# Patient Record
Sex: Female | Born: 1990 | Race: Black or African American | Hispanic: No | Marital: Single | Smoking: Current every day smoker
Health system: Southern US, Community
[De-identification: ages and names within clinical notes are randomized; demographics above are authoritative.]

## PROBLEM LIST (undated history)

## (undated) HISTORY — PX: TONSILLECTOMY: SUR1361

---

## 2016-04-16 ENCOUNTER — Emergency Department (HOSPITAL_COMMUNITY)
Admission: EM | Admit: 2016-04-16 | Discharge: 2016-04-17 | Disposition: A | Discharge status: Home / Self Care | Payer: No Typology Code available for payment source | Attending: Emergency Medicine | Admitting: Emergency Medicine

## 2016-04-16 ENCOUNTER — Other Ambulatory Visit (HOSPITAL_COMMUNITY): Payer: Self-pay | Admitting: Radiology

## 2016-04-16 ENCOUNTER — Encounter (HOSPITAL_COMMUNITY): Payer: Self-pay | Admitting: Emergency Medicine

## 2016-04-16 DIAGNOSIS — Y939 Activity, unspecified: Secondary | ICD-10-CM | POA: Diagnosis not present

## 2016-04-16 DIAGNOSIS — M542 Cervicalgia: Secondary | ICD-10-CM | POA: Diagnosis not present

## 2016-04-16 DIAGNOSIS — M545 Low back pain, unspecified: Secondary | ICD-10-CM

## 2016-04-16 DIAGNOSIS — Y92410 Unspecified street and highway as the place of occurrence of the external cause: Secondary | ICD-10-CM | POA: Diagnosis not present

## 2016-04-16 DIAGNOSIS — S0990XA Unspecified injury of head, initial encounter: Secondary | ICD-10-CM | POA: Diagnosis not present

## 2016-04-16 DIAGNOSIS — Y999 Unspecified external cause status: Secondary | ICD-10-CM | POA: Insufficient documentation

## 2016-04-16 LAB — POC URINE PREG, ED: PREG TEST UR: NEGATIVE
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
## 2016-04-17 ENCOUNTER — Emergency Department (HOSPITAL_COMMUNITY): Payer: No Typology Code available for payment source

## 2016-04-17 MED ORDER — NAPROXEN 500 MG PO TABS: 500.0000 mg | ORAL_TABLET | Freq: Two times a day (BID) | ORAL | 0 refills | Status: DC

## 2016-04-17 MED ORDER — ACETAMINOPHEN 325 MG PO TABS
650.0000 mg | ORAL_TABLET | Freq: Once | ORAL | Status: AC
  Administered 2016-04-17: 650 mg via ORAL
  Filled 2016-04-17: qty 2

## 2016-04-17 MED ORDER — IBUPROFEN 400 MG PO TABS
400.0000 mg | ORAL_TABLET | Freq: Once | ORAL | Status: AC
  Administered 2016-04-17: 400 mg via ORAL
  Filled 2016-04-17: qty 1

## 2016-04-17 MED ORDER — METHOCARBAMOL 500 MG PO TABS: 1000.0000 mg | ORAL_TABLET | Freq: Four times a day (QID) | ORAL | 0 refills | Status: DC
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
## 2016-05-04 ENCOUNTER — Emergency Department (HOSPITAL_COMMUNITY)
Admission: EM | Admit: 2016-05-04 | Discharge: 2016-05-04 | Disposition: A | Discharge status: Home / Self Care | Payer: Self-pay | Attending: Emergency Medicine | Admitting: Emergency Medicine

## 2016-05-04 ENCOUNTER — Encounter (HOSPITAL_COMMUNITY): Payer: Self-pay

## 2016-05-04 DIAGNOSIS — T7840XA Allergy, unspecified, initial encounter: Secondary | ICD-10-CM | POA: Insufficient documentation

## 2016-05-04 DIAGNOSIS — L509 Urticaria, unspecified: Secondary | ICD-10-CM | POA: Insufficient documentation

## 2016-05-04 DIAGNOSIS — F17210 Nicotine dependence, cigarettes, uncomplicated: Secondary | ICD-10-CM | POA: Insufficient documentation

## 2016-05-04 MED ORDER — METHYLPREDNISOLONE SODIUM SUCC 125 MG IJ SOLR
125.0000 mg | Freq: Once | INTRAMUSCULAR | Status: AC
  Administered 2016-05-04: 125 mg via INTRAVENOUS
  Filled 2016-05-04: qty 2

## 2016-05-04 MED ORDER — DIPHENHYDRAMINE HCL 25 MG PO CAPS
ORAL_CAPSULE | ORAL | Status: AC
  Filled 2016-05-04: qty 1

## 2016-05-04 MED ORDER — DIPHENHYDRAMINE HCL 25 MG PO CAPS
25.0000 mg | ORAL_CAPSULE | Freq: Once | ORAL | Status: AC
  Administered 2016-05-04: 25 mg via ORAL

## 2016-05-04 MED ORDER — DIPHENHYDRAMINE HCL 50 MG/ML IJ SOLN
12.5000 mg | Freq: Once | INTRAMUSCULAR | Status: AC
  Administered 2016-05-04: 12.5 mg via INTRAVENOUS
  Filled 2016-05-04: qty 1

## 2016-05-04 MED ORDER — EPINEPHRINE 0.3 MG/0.3ML IJ SOAJ: 0.3000 mg | Freq: Once | INTRAMUSCULAR | 1 refills | Status: AC

## 2016-05-04 MED ORDER — FAMOTIDINE IN NACL 20-0.9 MG/50ML-% IV SOLN
20.0000 mg | Freq: Once | INTRAVENOUS | Status: AC
  Administered 2016-05-04: 20 mg via INTRAVENOUS
  Filled 2016-05-04: qty 50

## 2016-05-04 MED ORDER — DIPHENHYDRAMINE HCL 25 MG PO TABS: 25.0000 mg | ORAL_TABLET | Freq: Three times a day (TID) | ORAL | 0 refills | Status: DC | PRN

## 2016-05-04 MED ORDER — FAMOTIDINE 20 MG PO TABS: 20.0000 mg | ORAL_TABLET | Freq: Two times a day (BID) | ORAL | 0 refills | Status: DC

## 2016-05-04 MED ORDER — PREDNISONE 20 MG PO TABS: 60.0000 mg | ORAL_TABLET | Freq: Every day | ORAL | 0 refills | Status: DC
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
## 2016-05-04 NOTE — ED Provider Notes (Signed)
[facility]-EMERGENCY DEPT Provider Note   CSN: [id number] Arrival date & time: 05/04/16  1740   By signing my name below, I, Martha Zuniga, attest that this documentation has been prepared under the direction and in the presence of Linwood Dibbles, MD . Electronically Signed: Freida Zuniga, Scribe. 05/04/2016. 6:45 PM.  History   Chief Complaint Chief Complaint  Patient presents with  . Allergic Reaction    The history is provided by the patient. No language interpreter was used.    HPI Comments:  Martha Zuniga is a 26 y.o. female who presents to the Emergency Department complaining of a pruritic diffuse rash which began ~1730 today. Pt states she took motrin prior to symptom onset. She has taken motrin in the past without incident but states she has had a mild reaction to a medication containing ibuprofen in the past and has never been sure if the reaction was due to the  ibuprofen. Pt denies SOB. No difficulty swallowing. No other new meds/lotions/soaps.   History reviewed. No pertinent past medical history.  There are no active problems to display for this patient.   Past Surgical History:  Procedure Laterality Date  . TONSILLECTOMY      OB History    No data available       Home Medications    Prior to Admission medications   Medication Sig Start Date End Date Taking? Authorizing Provider  diphenhydrAMINE (BENADRYL) 25 MG tablet Take 1 tablet (25 mg total) by mouth every 8 (eight) hours as needed for itching. 05/04/16   Linwood Dibbles, MD  famotidine (PEPCID) 20 MG tablet Take 1 tablet (20 mg total) by mouth 2 (two) times daily. 05/04/16   Linwood Dibbles, MD  methocarbamol (ROBAXIN) 500 MG tablet Take 2 tablets (1,000 mg total) by mouth 4 (four) times daily. 04/17/16   Renne Crigler, PA-C  naproxen (NAPROSYN) 500 MG tablet Take 1 tablet (500 mg total) by mouth 2 (two) times daily. 04/17/16   Renne Crigler, PA-C  predniSONE (DELTASONE) 20 MG tablet Take 3 tablets (60 mg total) by mouth daily.  05/04/16   Linwood Dibbles, MD    Family History History reviewed. No pertinent family history.  Social History Social History  Substance Use Topics  . Smoking status: Current Every Day Smoker    Packs/day: 0.50    Types: Cigarettes  . Smokeless tobacco: Never Used  . Alcohol use Yes     Comment: rarely     Allergies   Tylenol [acetaminophen]; Ibuprofen; and Penicillins   Review of Systems Review of Systems  Constitutional: Negative for chills and fever.  HENT: Negative for trouble swallowing.   Respiratory: Negative for shortness of breath.   Cardiovascular: Negative for chest pain.  Skin: Positive for rash.     Physical Exam Updated Vital Signs BP 98/65 (BP Location: Left Arm)   Pulse 80   Temp 97.5 F (36.4 C) (Oral)   Resp 18   Ht  (1.753 m)   Wt (!) 155.6 kg   LMP 05/04/2016 (Exact Date)   SpO2 100%   BMI 50.65 kg/m   Physical Exam  Constitutional: She appears well-developed and well-nourished. No distress.  HENT:  Head: Normocephalic and atraumatic.  Right Ear: External ear normal.  Left Ear: External ear normal.  Mouth/Throat: Oropharynx is clear and moist. No uvula swelling.  Eyes: Conjunctivae are normal. Right eye exhibits no discharge. Left eye exhibits no discharge. No scleral icterus.  Neck: Neck supple. No tracheal deviation present.  Cardiovascular:  Normal rate, regular rhythm and intact distal pulses.   Pulmonary/Chest: Effort normal and breath sounds normal. No stridor. No respiratory distress. She has no wheezes. She has no rales.  Abdominal: Soft. Bowel sounds are normal. She exhibits no distension. There is no tenderness. There is no rebound and no guarding.  Musculoskeletal: She exhibits no edema or tenderness.  Neurological: She is alert. She has normal strength. No sensory deficit. Cranial nerve deficit: no gross deficits. She exhibits normal muscle tone. She displays no seizure activity. Coordination normal.  Skin: Skin is warm and  dry. Rash noted. Rash is urticarial.  diffusely on extremities and torso   Psychiatric: She has a normal mood and affect.  Nursing note and vitals reviewed.    ED Treatments / Results  DIAGNOSTIC STUDIES:  Oxygen Saturation is 100% on RA, normal by my interpretation.    COORDINATION OF CARE:  6:45 PM Discussed treatment plan with pt at bedside and pt agreed to plan.  Labs (all labs ordered are listed, but only abnormal results are displayed) Labs Reviewed - No data to display  EKG  EKG Interpretation None       Radiology No results found.  Procedures Procedures (including critical care time)  Medications Ordered in ED Medications  diphenhydrAMINE (BENADRYL) capsule 25 mg (25 mg Oral Given 05/04/16 1755)  diphenhydrAMINE (BENADRYL) injection 12.5 mg (12.5 mg Intravenous Given 05/04/16 1859)  famotidine (PEPCID) IVPB 20 mg premix (0 mg Intravenous Stopped 05/04/16 1929)  methylPREDNISolone sodium succinate (SOLU-MEDROL) 125 mg/2 mL injection 125 mg (125 mg Intravenous Given 05/04/16 1859)     Initial Impression / Assessment and Plan / ED Course  I have reviewed the triage vital signs and the nursing notes.  Pertinent labs & imaging results that were available during my care of the patient were reviewed by me and considered in my medical decision making (see chart for details).  Clinical Course as of May 09 1322  Thu May 04, 2016  2052 Sx improved after treatment.  No further hives   [JK]    Clinical Course User Index [JK] Linwood Dibbles, MD    Consistent with an allergic reaction.  No resp sx.  Sx improved with treatment in the ED.   Dc home with antihistamines, steroids and epi pen in case she has a recurrent reaction.  Follow up with an allergist  Final Clinical Impressions(s) / ED Diagnoses   Final diagnoses:  Allergic reaction, initial encounter  Urticaria    New Prescriptions Discharge Medication List as of 05/04/2016  8:52 PM    START taking these  medications   Details  diphenhydrAMINE (BENADRYL) 25 MG tablet Take 1 tablet (25 mg total) by mouth every 8 (eight) hours as needed for itching., Starting Thu 05/04/2016, Print    EPINEPHrine 0.3 mg/0.3 mL IJ SOAJ injection Inject 0.3 mLs (0.3 mg total) into the muscle once., Starting Thu 05/04/2016, Print    famotidine (PEPCID) 20 MG tablet Take 1 tablet (20 mg total) by mouth 2 (two) times daily., Starting Thu 05/04/2016, Print    predniSONE (DELTASONE) 20 MG tablet Take 3 tablets (60 mg total) by mouth daily., Starting Thu 05/04/2016, Print       I personally performed the services described in this documentation, which was scribed in my presence.  The recorded information has been reviewed and is accurate.     Linwood Dibbles, MD 05/08/16 1325

## 2016-05-04 NOTE — ED Triage Notes (Signed)
Pt endorses taking motrin about 1 hour prior to arrival and began having hives approximately 30 minutes later. Pt has hives all over. Pt only has allergy to penicillin and tylenol. Pt has no other known allergies. Airway intact. No throat swelling.

## 2016-09-30 ENCOUNTER — Encounter (HOSPITAL_COMMUNITY): Payer: Self-pay | Admitting: Emergency Medicine

## 2016-09-30 ENCOUNTER — Emergency Department (HOSPITAL_COMMUNITY)
Admission: EM | Admit: 2016-09-30 | Discharge: 2016-09-30 | Disposition: A | Discharge status: Home / Self Care | Payer: PRIVATE HEALTH INSURANCE | Attending: Emergency Medicine | Admitting: Emergency Medicine

## 2016-09-30 DIAGNOSIS — R112 Nausea with vomiting, unspecified: Secondary | ICD-10-CM | POA: Insufficient documentation

## 2016-09-30 DIAGNOSIS — R110 Nausea: Secondary | ICD-10-CM

## 2016-09-30 DIAGNOSIS — F17210 Nicotine dependence, cigarettes, uncomplicated: Secondary | ICD-10-CM | POA: Diagnosis not present

## 2016-09-30 DIAGNOSIS — R1011 Right upper quadrant pain: Secondary | ICD-10-CM | POA: Insufficient documentation

## 2016-09-30 LAB — COMPREHENSIVE METABOLIC PANEL
ALBUMIN: 3.4 g/dL — AB (ref 3.5–5.0)
ALT: 10 U/L — ABNORMAL LOW (ref 14–54)
AST: 15 U/L (ref 15–41)
Alkaline Phosphatase: 51 U/L (ref 38–126)
Anion gap: 9 (ref 5–15)
BUN: 9 mg/dL (ref 6–20)
CO2: 27 mmol/L (ref 22–32)
CREATININE: 0.56 mg/dL (ref 0.44–1.00)
Calcium: 8.8 mg/dL — ABNORMAL LOW (ref 8.9–10.3)
Chloride: 105 mmol/L (ref 101–111)
GFR calc Af Amer: >60 mL/min (ref >60)
GFR calc non Af Amer: >60 mL/min (ref >60)
Glucose, Bld: 93 mg/dL (ref 65–99)
POTASSIUM: 3.8 mmol/L (ref 3.5–5.1)
SODIUM: 141 mmol/L (ref 135–145)
Total Bilirubin: 0.4 mg/dL (ref 0.3–1.2)
Total Protein: 7.2 g/dL (ref 6.5–8.1)

## 2016-09-30 LAB — URINALYSIS, ROUTINE W REFLEX MICROSCOPIC
BILIRUBIN URINE: NEGATIVE
Glucose, UA: NEGATIVE mg/dL
HGB URINE DIPSTICK: NEGATIVE
Ketones, ur: NEGATIVE mg/dL
Nitrite: NEGATIVE
PROTEIN: 30 mg/dL — AB
SPECIFIC GRAVITY, URINE: 1.028 (ref 1.005–1.030)
pH: 6 (ref 5.0–8.0)

## 2016-09-30 LAB — CBC
HEMATOCRIT: 40 % (ref 36.0–46.0)
Hemoglobin: 12.9 g/dL (ref 12.0–15.0)
MCH: 29.2 pg (ref 26.0–34.0)
MCHC: 32.3 g/dL (ref 30.0–36.0)
MCV: 90.5 fL (ref 78.0–100.0)
PLATELETS: 404 10*3/uL — AB (ref 150–400)
RBC: 4.42 MIL/uL (ref 3.87–5.11)
RDW: 14.9 % (ref 11.5–15.5)
WBC: 16.1 10*3/uL — AB (ref 4.0–10.5)

## 2016-09-30 LAB — PREGNANCY, URINE: PREG TEST UR: NEGATIVE

## 2016-09-30 LAB — LIPASE, BLOOD: LIPASE: 21 U/L (ref 11–51)

## 2016-09-30 MED ORDER — ONDANSETRON HCL 4 MG/2ML IJ SOLN
4.0000 mg | Freq: Once | INTRAMUSCULAR | Status: AC
  Administered 2016-09-30: 4 mg via INTRAVENOUS
  Filled 2016-09-30: qty 2

## 2016-09-30 MED ORDER — GI COCKTAIL ~~LOC~~
30.0000 mL | Freq: Once | ORAL | Status: AC
  Administered 2016-09-30: 30 mL via ORAL
  Filled 2016-09-30: qty 30

## 2016-09-30 MED ORDER — DICYCLOMINE HCL 20 MG PO TABS: 20.0000 mg | ORAL_TABLET | Freq: Two times a day (BID) | ORAL | 0 refills | Status: AC

## 2016-09-30 MED ORDER — MORPHINE SULFATE (PF) 4 MG/ML IV SOLN
4.0000 mg | Freq: Once | INTRAVENOUS | Status: AC
  Administered 2016-09-30: 4 mg via INTRAVENOUS
  Filled 2016-09-30: qty 1

## 2016-09-30 MED ORDER — FAMOTIDINE 40 MG PO TABS: 40.0000 mg | ORAL_TABLET | Freq: Every day | ORAL | 0 refills | Status: AC

## 2016-09-30 MED ORDER — DICYCLOMINE HCL 10 MG PO CAPS
10.0000 mg | ORAL_CAPSULE | Freq: Once | ORAL | Status: AC
  Administered 2016-09-30: 10 mg via ORAL
  Filled 2016-09-30: qty 1

## 2016-09-30 MED ORDER — CEPHALEXIN 500 MG PO CAPS: 500.0000 mg | ORAL_CAPSULE | Freq: Four times a day (QID) | ORAL | 0 refills | Status: AC

## 2016-09-30 MED ORDER — ONDANSETRON 4 MG PO TBDP: 4.0000 mg | ORAL_TABLET | Freq: Three times a day (TID) | ORAL | 0 refills | Status: AC | PRN

## 2016-09-30 MED ORDER — DEXTROSE 5 % IV SOLN
1.0000 g | Freq: Once | INTRAVENOUS | Status: AC
  Administered 2016-09-30: 1 g via INTRAVENOUS
  Filled 2016-09-30: qty 10

## 2016-09-30 NOTE — Discharge Instructions (Signed)
You have been seen in the Emergency Department (ED) for abdominal pain.  Your evaluation did not identify a clear cause of your symptoms but was generally reassuring.  I have scheduled a 9 AM ultrasound appointment for tomorrow morning to look at the gallbladder. Come back to the [facility] Emergency Department at 8:30 AM to check in.   Please follow up as instructed above regarding today?s emergent visit and the symptoms that are bothering you.  Return to the ED if your abdominal pain worsens or fails to improve, you develop bloody vomiting, bloody diarrhea, you are unable to tolerate fluids due to vomiting, fever greater than 101, or other symptoms that concern you.

## 2016-09-30 NOTE — ED Triage Notes (Signed)
Patient c/o upper abd pain x3 days that is progressively getting worse. Per patient nausea, vomiting, and diarrhea. Patient unsure of any fevers but reports "sweats and chills." Denies any urinary symptoms.

## 2016-09-30 NOTE — ED Provider Notes (Signed)
Emergency Department Provider Note   I have reviewed the triage vital signs and the nursing notes.   HISTORY  Chief Complaint Abdominal Pain   HPI Martha Zuniga is a 26 y.o. female presents to the [facility] department for evaluation of epigastric abdominal pain that is progressively worsening over the past 3 days. The patient has had nausea, vomiting, diarrhea. She reports some subjective fevers and chills. No dysuria, hesitancy, urgency. No vaginal bleeding or discharge. She states she has not been eating very much so is unsure if it makes it better or worse. Pain does seem to radiate to the back. No lower abdominal pain.    History reviewed. No pertinent past medical history.  There are no active problems to display for this patient.   Past Surgical History:  Procedure Laterality Date  . TONSILLECTOMY      Current Outpatient Rx  . Order #: [id number] Class: Print  . Order #: [id number] Class: Print  . Order #: [id number] Class: Print  . Order #: [id number] Class: Print    Allergies Tylenol [acetaminophen]; Ibuprofen; and Penicillins  History reviewed. No pertinent family history.  Social History Social History  Substance Use Topics  . Smoking status: Current Every Day Smoker    Packs/day: 0.50    Types: Cigarettes  . Smokeless tobacco: Never Used  . Alcohol use Yes     Comment: rarely    Review of Systems  Constitutional: No fever/chills Eyes: No visual changes. ENT: No sore throat. Cardiovascular: Denies chest pain. Respiratory: Denies shortness of breath. Gastrointestinal: Positive epigastric abdominal pain. Positive nausea, vomiting, and diarrhea.  No constipation. Genitourinary: Negative for dysuria. Musculoskeletal: Negative for back pain. Skin: Negative for rash. Neurological: Negative for headaches, focal weakness or numbness.  10-point ROS otherwise negative.  ____________________________________________   PHYSICAL EXAM:  VITAL SIGNS: ED Triage  Vitals [09/30/16 1337]  Enc Vitals Group     BP 115/69     Pulse Rate 60     Resp (!) 22     Temp 97.6 F (36.4 C)     Temp Source Oral     SpO2 99 %     Weight (!) 350 lb (158.8 kg)     Height _0  (1.753 m)     Pain Score 10   Constitutional: Alert and oriented. Tearful and sitting up in bed.  Eyes: Conjunctivae are normal.  Head: Atraumatic. Nose: No congestion/rhinnorhea. Mouth/Throat: Mucous membranes are moist.  Neck: No stridor.  Cardiovascular: Normal rate, regular rhythm. Good peripheral circulation. Grossly normal heart sounds.   Respiratory: Normal respiratory effort.  No retractions. Lungs CTAB. Gastrointestinal: Soft with epigastric and RUQ tenderness. No rebound or guarding. No distention.  Musculoskeletal: No lower extremity tenderness nor edema. No gross deformities of extremities. Neurologic:  Normal speech and language. No gross focal neurologic deficits are appreciated.  Skin:  Skin is warm, dry and intact. No rash noted.  ____________________________________________   LABS (all labs ordered are listed, but only abnormal results are displayed)  Labs Reviewed  COMPREHENSIVE METABOLIC PANEL - Abnormal; Notable for the following:       Result Value   Calcium 8.8 (*)    Albumin 3.4 (*)    ALT 10 (*)    All other components within normal limits  CBC - Abnormal; Notable for the following:    WBC 16.1 (*)    Platelets 404 (*)    All other components within normal limits  URINALYSIS, ROUTINE W REFLEX MICROSCOPIC - Abnormal; Notable for the following:  APPearance CLOUDY (*)    Protein, ur 30 (*)    Leukocytes, UA MODERATE (*)    Bacteria, UA RARE (*)    Squamous Epithelial / LPF 6-30 (*)    All other components within normal limits  URINE CULTURE  LIPASE, BLOOD  PREGNANCY, URINE   ____________________________________________  RADIOLOGY  None ____________________________________________   PROCEDURES  Procedure(s) performed:    Procedures  None ____________________________________________   INITIAL IMPRESSION / ASSESSMENT AND PLAN / ED COURSE  Pertinent labs & imaging results that were available during my care of the patient were reviewed by me and considered in my medical decision making (see chart for details).  Patient presents emergency department for evaluation of epigastric abdominal pain with associated nausea, vomiting, diarrhea. Symptoms most consistent with a gastroenteritis. Patient does, however, have epigastric and right upper quadrant abdominal discomfort to palpation. With gradually worsening pain over several days plan to obtain right upper quadrant ultrasound to evaluate for acute cholecystitis.  Unable to obtain RUQ [place] at this time. Will follow labs to decide on CT now vs RUQ [place] at 9 AM tomorrow.   03:12 PM Patient looking much more comfortable. No active vomiting. Labs with normal LFTs, Bilirubin, and alk phos. Elevated WBC count of unclear significance. With significant clinical improvement and pain on exam that is less severe, plan for deferred CT at this time. Have scheduled 9 AM [place] tomorrow morning here at [facility].   Care transferred to Dr. Roderic Palau pending UA and urine pregnancy. Anticipate discharge +/- abx for UTI pending UA. No evidence to suggest Pyelonephritis.   ____________________________________________  FINAL CLINICAL IMPRESSION(S) / ED DIAGNOSES  Final diagnoses:  RUQ abdominal pain  Nausea     MEDICATIONS GIVEN DURING THIS VISIT:  Medications  ondansetron (ZOFRAN) injection 4 mg (4 mg Intravenous Given 09/30/16 1448)  morphine 4 MG/ML injection 4 mg (4 mg Intravenous Given 09/30/16 1448)  dicyclomine (BENTYL) capsule 10 mg (10 mg Oral Given 09/30/16 1531)  gi cocktail (Maalox,Lidocaine,Donnatal) (30 mLs Oral Given 09/30/16 1531)  cefTRIAXone (ROCEPHIN) 1 g in dextrose 5 % 50 mL IVPB (0 g Intravenous Stopped 09/30/16 1726)     NEW OUTPATIENT MEDICATIONS STARTED  DURING THIS VISIT:  Discharge Medication List as of 09/30/2016  3:55 PM    START taking these medications   Details  dicyclomine (BENTYL) 20 MG tablet Take 1 tablet (20 mg total) by mouth 2 (two) times daily., Starting Sat 09/30/2016, Print    famotidine (PEPCID) 40 MG tablet Take 1 tablet (40 mg total) by mouth daily., Starting Sat 09/30/2016, Print    ondansetron (ZOFRAN ODT) 4 MG disintegrating tablet Take 1 tablet (4 mg total) by mouth every 8 (eight) hours as needed for nausea or vomiting., Starting Sat 09/30/2016, Print        Note:  This document was prepared using Dragon voice recognition software and may include unintentional dictation errors.  Nanda Quinton, MD Emergency Medicine    Shelsea Hangartner, [facility], MD 09/30/16 [phone]

## 2016-09-30 NOTE — ED Provider Notes (Signed)
Patient's urine suggests UTI. She was given some Rocephin and will be put on Keflex She is return tomorrow for ultrasound of abdomen   Bethann Berkshire, MD 09/30/16 1735

## 2016-10-01 ENCOUNTER — Inpatient Hospital Stay (HOSPITAL_COMMUNITY): Admission: RE | Admit: 2016-10-01 | Payer: PRIVATE HEALTH INSURANCE | Source: Ambulatory Visit

## 2016-10-02 LAB — URINE CULTURE

## 2017-08-09 ENCOUNTER — Emergency Department (HOSPITAL_COMMUNITY)
Admission: EM | Admit: 2017-08-09 | Discharge: 2017-08-09 | Disposition: A | Discharge status: Home / Self Care | Payer: PRIVATE HEALTH INSURANCE | Attending: Emergency Medicine | Admitting: Emergency Medicine

## 2017-08-09 ENCOUNTER — Other Ambulatory Visit: Payer: Self-pay

## 2017-08-09 ENCOUNTER — Encounter (HOSPITAL_COMMUNITY): Payer: Self-pay | Admitting: Emergency Medicine

## 2017-08-09 DIAGNOSIS — F17210 Nicotine dependence, cigarettes, uncomplicated: Secondary | ICD-10-CM | POA: Insufficient documentation

## 2017-08-09 DIAGNOSIS — L02211 Cutaneous abscess of abdominal wall: Secondary | ICD-10-CM | POA: Insufficient documentation

## 2017-08-09 DIAGNOSIS — Z79899 Other long term (current) drug therapy: Secondary | ICD-10-CM | POA: Insufficient documentation

## 2017-08-09 NOTE — Discharge Instructions (Signed)
Please soak the area in warm Epsom salt water for 15 min until the area is healed. Finish the Doxycycline. Change the dressing daily. See your MD for additional evaluation if not improving.

## 2017-08-09 NOTE — ED Triage Notes (Signed)
Pt states she has abscess to the left abd area x one week. Pt states she has been taking doxycycline for the same.

## 2017-08-09 NOTE — ED Provider Notes (Signed)
[facility] Provider [facility]   CSN: [id number] Arrival date & time: 08/09/17  2107     History   Chief Complaint Chief Complaint  Patient presents with  . Abscess    HPI Martha Zuniga is a 27 y.o. female.  Pt is a 27 y/o female who presents to the ED with c/o abscess to the left abd. Pt has had abscess for over 1 week. She attempted to "pop" the abscess and now has an open wound present. [facility] and placed on doxycycline. She is concerned that the wound is open and draining at times. No fever, chills, nausea, or vomiting. No hx of compromise to immune system.  The history is provided by the patient.    History reviewed. No pertinent past medical history.  There are no active problems to display for this patient.   Past Surgical History:  Procedure Laterality Date  . TONSILLECTOMY       OB History    Gravida  0   Para  0   Term  0   Preterm  0   AB  0   Living  0     SAB  0   TAB  0   Ectopic  0   Multiple  0   Live Births  0            Home Medications    Prior to Admission medications   Medication Sig Start Date End Date Taking? Authorizing Provider  cephALEXin (KEFLEX) 500 MG capsule Take 1 capsule (500 mg total) by mouth 4 (four) times daily. 09/30/16   Bethann BerkshireZammit, Joseph, MD  dicyclomine (BENTYL) 20 MG tablet Take 1 tablet (20 mg total) by mouth 2 (two) times daily. 09/30/16   Long, Arlyss RepressJoshua G, MD  famotidine (PEPCID) 40 MG tablet Take 1 tablet (40 mg total) by mouth daily. 09/30/16   Long, Arlyss RepressJoshua G, MD  ondansetron (ZOFRAN ODT) 4 MG disintegrating tablet Take 1 tablet (4 mg total) by mouth every 8 (eight) hours as needed for nausea or vomiting. 09/30/16   Long, Arlyss RepressJoshua G, MD    Family History History reviewed. No pertinent family history.  Social History Social History   Tobacco Use  . Smoking status: Current Every Day Smoker    Packs/day: 0.50    Types: Cigarettes  . Smokeless tobacco: Never Used    Substance Use Topics  . Alcohol use: Yes    Comment: rarely  . Drug use: Yes    Types: Marijuana     Allergies   Tylenol [acetaminophen]; Ibuprofen; and Penicillins   Review of Systems Review of Systems  Constitutional: Negative for activity change, diaphoresis and fever.       All ROS Neg except as noted in HPI  HENT: Negative for nosebleeds.   Eyes: Negative for photophobia and discharge.  Respiratory: Negative for cough, shortness of breath and wheezing.   Cardiovascular: Negative for chest pain and palpitations.  Gastrointestinal: Negative for abdominal pain, blood in stool, nausea and vomiting.  Genitourinary: Negative for dysuria, frequency and hematuria.  Musculoskeletal: Negative for arthralgias, back pain and neck pain.  Skin: Positive for wound.       Skin abscess  Neurological: Negative for dizziness, seizures and speech difficulty.  Psychiatric/Behavioral: Negative for confusion and hallucinations.     Physical Exam Updated Vital Signs BP (!) 135/46 (BP Location: Right Arm)   Pulse 100   Temp 98.6 F (37 C) (Oral)   Resp  18   Ht 5\' 9"  (1.753 m)   Wt (!) 147.4 kg (325 lb)   LMP 07/08/2017   SpO2 100%   BMI 47.99 kg/m   Physical Exam  Constitutional: She is oriented to person, place, and time. She appears well-developed and well-nourished.  Non-toxic appearance.  HENT:  Head: Normocephalic.  Right Ear: Tympanic membrane and external ear normal.  Left Ear: Tympanic membrane and external ear normal.  Eyes: Pupils are equal, round, and reactive to light. EOM and lids are normal.  Neck: Normal range of motion. Neck supple. Carotid bruit is not present.  Cardiovascular: Normal rate, regular rhythm, normal heart sounds, intact distal pulses and normal pulses.  Pulmonary/Chest: Breath sounds normal. No respiratory distress.  Abdominal: Soft. Bowel sounds are normal. There is no tenderness. There is no guarding.    Musculoskeletal: Normal range of motion.   Lymphadenopathy:       Head (right side): No submandibular adenopathy present.       Head (left side): No submandibular adenopathy present.    She has no cervical adenopathy.  Neurological: She is alert and oriented to person, place, and time. She has normal strength. No cranial nerve deficit or sensory deficit.  Skin: Skin is warm and dry.  Psychiatric: She has a normal mood and affect. Her speech is normal.  Nursing note and vitals reviewed.    ED Treatments / Results  Labs (all labs ordered are listed, but only abnormal results are displayed) Labs Reviewed - No data to display  EKG None  Radiology No results found.  Procedures Procedures (including critical care time)  Medications Ordered in ED Medications - No data to display   Initial Impression / Assessment and Plan / ED Course  I have reviewed the triage vital signs and the nursing notes.  Pertinent labs & imaging results that were available during my care of the patient were reviewed by me and considered in my medical decision making (see chart for details).       Final Clinical Impressions(s) / ED Diagnoses MDM  Vital signs stable. Pulse ox 100% on room air. Pt's wound is granulating. No red streaks noted.  Pt is on doxycycline. Discussed with patient that the wound should close on it's own, and we wound not close the wound as this will cause more problem. Pt to follow up with PCP or return to the ED if any changes or problem.   Final diagnoses:  Abdominal wall abscess    ED Discharge Orders    None       Ivery Quale, PA-C 08/09/17 2324    Devoria Albe, MD 08/10/17 [phone]

## 2020-05-17 ENCOUNTER — Other Ambulatory Visit: Payer: Self-pay

## 2020-05-17 ENCOUNTER — Encounter (HOSPITAL_COMMUNITY): Payer: Self-pay | Admitting: *Deleted

## 2020-05-17 ENCOUNTER — Emergency Department (HOSPITAL_COMMUNITY): Payer: BLUE CROSS/BLUE SHIELD

## 2020-05-17 ENCOUNTER — Emergency Department (HOSPITAL_COMMUNITY)
Admission: EM | Admit: 2020-05-17 | Discharge: 2020-05-18 | Disposition: A | Discharge status: Home / Self Care | Payer: BLUE CROSS/BLUE SHIELD | Attending: Emergency Medicine | Admitting: Emergency Medicine

## 2020-05-17 DIAGNOSIS — F17210 Nicotine dependence, cigarettes, uncomplicated: Secondary | ICD-10-CM | POA: Diagnosis not present

## 2020-05-17 DIAGNOSIS — K5900 Constipation, unspecified: Secondary | ICD-10-CM | POA: Insufficient documentation

## 2020-05-17 DIAGNOSIS — K6289 Other specified diseases of anus and rectum: Secondary | ICD-10-CM | POA: Diagnosis not present

## 2020-05-17 MED ORDER — FLEET ENEMA 7-19 GM/118ML RE ENEM: 1.0000 | ENEMA | Freq: Every day | RECTAL | 0 refills | Status: AC | PRN

## 2020-05-17 MED ORDER — SENNOSIDES-DOCUSATE SODIUM 8.6-50 MG PO TABS: 1.0000 | ORAL_TABLET | Freq: Every evening | ORAL | 0 refills | Status: AC | PRN

## 2020-05-17 MED ORDER — LIDOCAINE HCL URETHRAL/MUCOSAL 2 % EX GEL
1.0000 "application " | Freq: Once | CUTANEOUS | Status: AC
  Administered 2020-05-17: 1 via TOPICAL
  Filled 2020-05-17: qty 10

## 2020-05-17 MED ORDER — POLYETHYLENE GLYCOL 3350 17 G PO PACK: 17.0000 g | PACK | Freq: Every day | ORAL | 0 refills | Status: AC

## 2020-05-17 NOTE — ED Notes (Signed)
Pt ambulatory to er room number 10, pt states that she has not had a bm since Wednesday, pt states that she is now having spasmodic abd pain.

## 2020-05-17 NOTE — ED Triage Notes (Signed)
No bowel movement for the past 5 days, states she is unable to push stool out due to pain in rectal area

## 2020-05-17 NOTE — Discharge Instructions (Signed)
You were seen in the emergency department today for constipation.  We recommend that you use one or more of the following over-the-counter medications in the order described: °  °1)  Colace (or Dulcolax) 100 mg:  This is a stool softener, and you may take it once or twice a day as needed. °2)  Senna tablets:  This is a bowel stimulant that will help "push" out your stool. It is the next step to add after you have tried a stool softener. °3)  Miralax (powder):  This medication works by drawing additional fluid into your intestines and helps to flush out your stool.  Mix the powder with water or juice according to label instructions.  It may help if the Colace and Senna are not sufficient, but you must be sure to use the recommended amount of water or juice when you mix up the powder. °Remember that narcotic pain medications are constipating, so avoid them or minimize their use.  Drink plenty of fluids. ° °Please return to the Emergency Department immediately if you develop new or worsening symptoms that concern you, such as (but not limited to) fever > 101 degrees, severe abdominal pain, or persistent vomiting. ° °

## 2020-05-17 NOTE — ED Provider Notes (Signed)
Emergency Department Provider Note   I have reviewed the triage vital signs and the nursing notes.   HISTORY  Chief Complaint No chief complaint on file.   HPI Martha Zuniga is a 30 y.o. female presents to the emergency department with constipation and rectal pain.  Symptoms have developed over the past 5 days.  No prior history of constipation.  Patient states it feels like she is going to have a bowel movement but then cannot ultimately passed significant stool.  She is not having bleeding but is having burning pain.  No fevers or chills.  No abdominal discomfort.  No radiation of symptoms or modifying factors.  No dysuria, hesitancy, urgency.  She took 2 sips of magnesium citrate and states that she did take some stool softeners but no significant relief.    History reviewed. No pertinent past medical history.  There are no problems to display for this patient.   Past Surgical History:  Procedure Laterality Date  . TONSILLECTOMY      Allergies Tylenol [acetaminophen], Ibuprofen, and Penicillins  No family history on file.  Social History Social History   Tobacco Use  . Smoking status: Current Every Day Smoker    Packs/day: 0.50    Types: Cigarettes  . Smokeless tobacco: Never Used  Vaping Use  . Vaping Use: Never used  Substance Use Topics  . Alcohol use: Yes    Comment: rarely  . Drug use: Yes    Types: Marijuana    Review of Systems  Constitutional: No fever/chills Eyes: No visual changes. ENT: No sore throat. Cardiovascular: Denies chest pain. Respiratory: Denies shortness of breath. Gastrointestinal: No abdominal pain.  No nausea, no vomiting.  No diarrhea. Positive constipation and rectal discomfort.  Genitourinary: Negative for dysuria. Musculoskeletal: Negative for back pain. Skin: Negative for rash. Neurological: Negative for headaches, focal weakness or numbness.  10-point ROS otherwise  negative.  ____________________________________________   PHYSICAL EXAM:  VITAL SIGNS: ED Triage Vitals  Enc Vitals Group     BP 05/17/20 1847 132/76     Pulse Rate 05/17/20 1847 (!) 55     Resp 05/17/20 1847 16     Temp 05/17/20 1847 98.5 F (36.9 C)     Temp Source 05/17/20 1847 Oral     SpO2 05/17/20 1847 93 %   Constitutional: Alert and oriented. Well appearing and in no acute distress. Eyes: Conjunctivae are normal.  Head: Atraumatic. Nose: No congestion/rhinnorhea. Mouth/Throat: Mucous membranes are moist. Neck: No stridor.   Cardiovascular: Normal rate, regular rhythm. Good peripheral circulation. Grossly normal heart sounds.   Respiratory: Normal respiratory effort.  No retractions. Lungs CTAB. Gastrointestinal: Soft and nontender. No distention.  Rectal exam performed with patient's verbal consent and nurse tech chaperone.  She has no external hemorrhoids.  No fissures or bleeding.  Digital rectal exam performed with some soft, brown stool but no hard, impacted stool.  Musculoskeletal: No gross deformities of extremities. Neurologic:  Normal speech and language.  Skin:  Skin is warm, dry and intact. No rash noted.  ____________________________________________   LABS (all labs ordered are listed, but only abnormal results are displayed)  Labs Reviewed - No data to display ____________________________________________  RADIOLOGY  DG Abdomen 1 View  Result Date: 05/18/2020 CLINICAL DATA:  Constipation for 5 days, no bowel movement since Wednesday, spasmodic abdominal pain EXAM: ABDOMEN - 1 VIEW COMPARISON:  None. FINDINGS: No high-grade obstructive bowel gas pattern. Moderate colonic stool burden with air and stool over the rectal vault.  No suspicious abdominal calcifications. Included lung bases are clear. Osseous structures are free of acute abnormality. IMPRESSION: Moderate colonic stool burden.  No high-grade obstruction. Electronically Signed   By: Kreg Shropshire  M.D.   On: 05/18/2020 00:44    ____________________________________________   PROCEDURES  Procedure(s) performed:   Procedures  None  ____________________________________________   INITIAL IMPRESSION / ASSESSMENT AND PLAN / ED COURSE  Pertinent labs & imaging results that were available during my care of the patient were reviewed by me and considered in my medical decision making (see chart for details).   Patient presents to the emergency department with rectal pain and constipation.  I do not see a external reason for her symptoms such as hemorrhoids or fissures.  She has soft, brown stool palpable in the rectal vault but nothing hard or impacted which I can digitally remove.  She is having some discomfort.  Plan for plain film abdomen pelvis to assess stool burned and discharge home with laxative/enema meds.   Care transferred to Dr. Judd Lien.  ____________________________________________  FINAL CLINICAL IMPRESSION(S) / ED DIAGNOSES  Final diagnoses:  Constipation, unspecified constipation type     MEDICATIONS GIVEN DURING THIS VISIT:  Medications  lidocaine (XYLOCAINE) 2 % jelly 1 application (1 application Topical Given by Other 05/17/20 2152)     NEW OUTPATIENT MEDICATIONS STARTED DURING THIS VISIT:  Discharge Medication List as of 05/18/2020  1:13 AM    START taking these medications   Details  polyethylene glycol (MIRALAX) 17 g packet Take 17 g by mouth daily., Starting Mon 05/17/2020, Normal    senna-docusate (SENOKOT-S) 8.6-50 MG tablet Take 1 tablet by mouth at bedtime as needed for mild constipation or moderate constipation., Starting Mon 05/17/2020, Normal    sodium phosphate (FLEET) 7-19 GM/118ML ENEM Place 133 mLs (1 enema total) rectally daily as needed for moderate constipation or severe constipation., Starting Mon 05/17/2020, Normal        Note:  This document was prepared using Dragon voice recognition software and may include unintentional dictation  errors.  Alona Bene, MD, [facility]    Damon Baisch, Arlyss Repress, MD 05/18/20 [phone]

## 2020-05-18 NOTE — ED Notes (Signed)
Pt to nursing station . Went over d/c papers. No signature obtained. ambulatory to lobby

## 2020-05-18 NOTE — ED Notes (Signed)
To XR

## 2020-05-18 NOTE — ED Provider Notes (Signed)
Care assumed from Dr. Jacqulyn Bath at shift change.  Patient awaiting results of an x-ray to evaluate stool burden.  This has been obtained and shows moderate colonic stool burden, but no high-grade obstruction.  Patient was unable to be disimpacted manually.  She will be discharged with fleets enemas, mag citrate, and work excuse.  She is to follow-up as needed.   Geoffery Lyons, MD 05/18/20 [phone]

## 2021-12-23 IMAGING — DX DG ABDOMEN 1V
2 series · 2 of 2 positions shown · non-contrast
Comparison: None.

CLINICAL DATA: Constipation for 5 days, no bowel movement since
[REDACTED], spasmodic abdominal pain

EXAM:
ABDOMEN - 1 VIEW

[abdomen kub (1 of 2)]
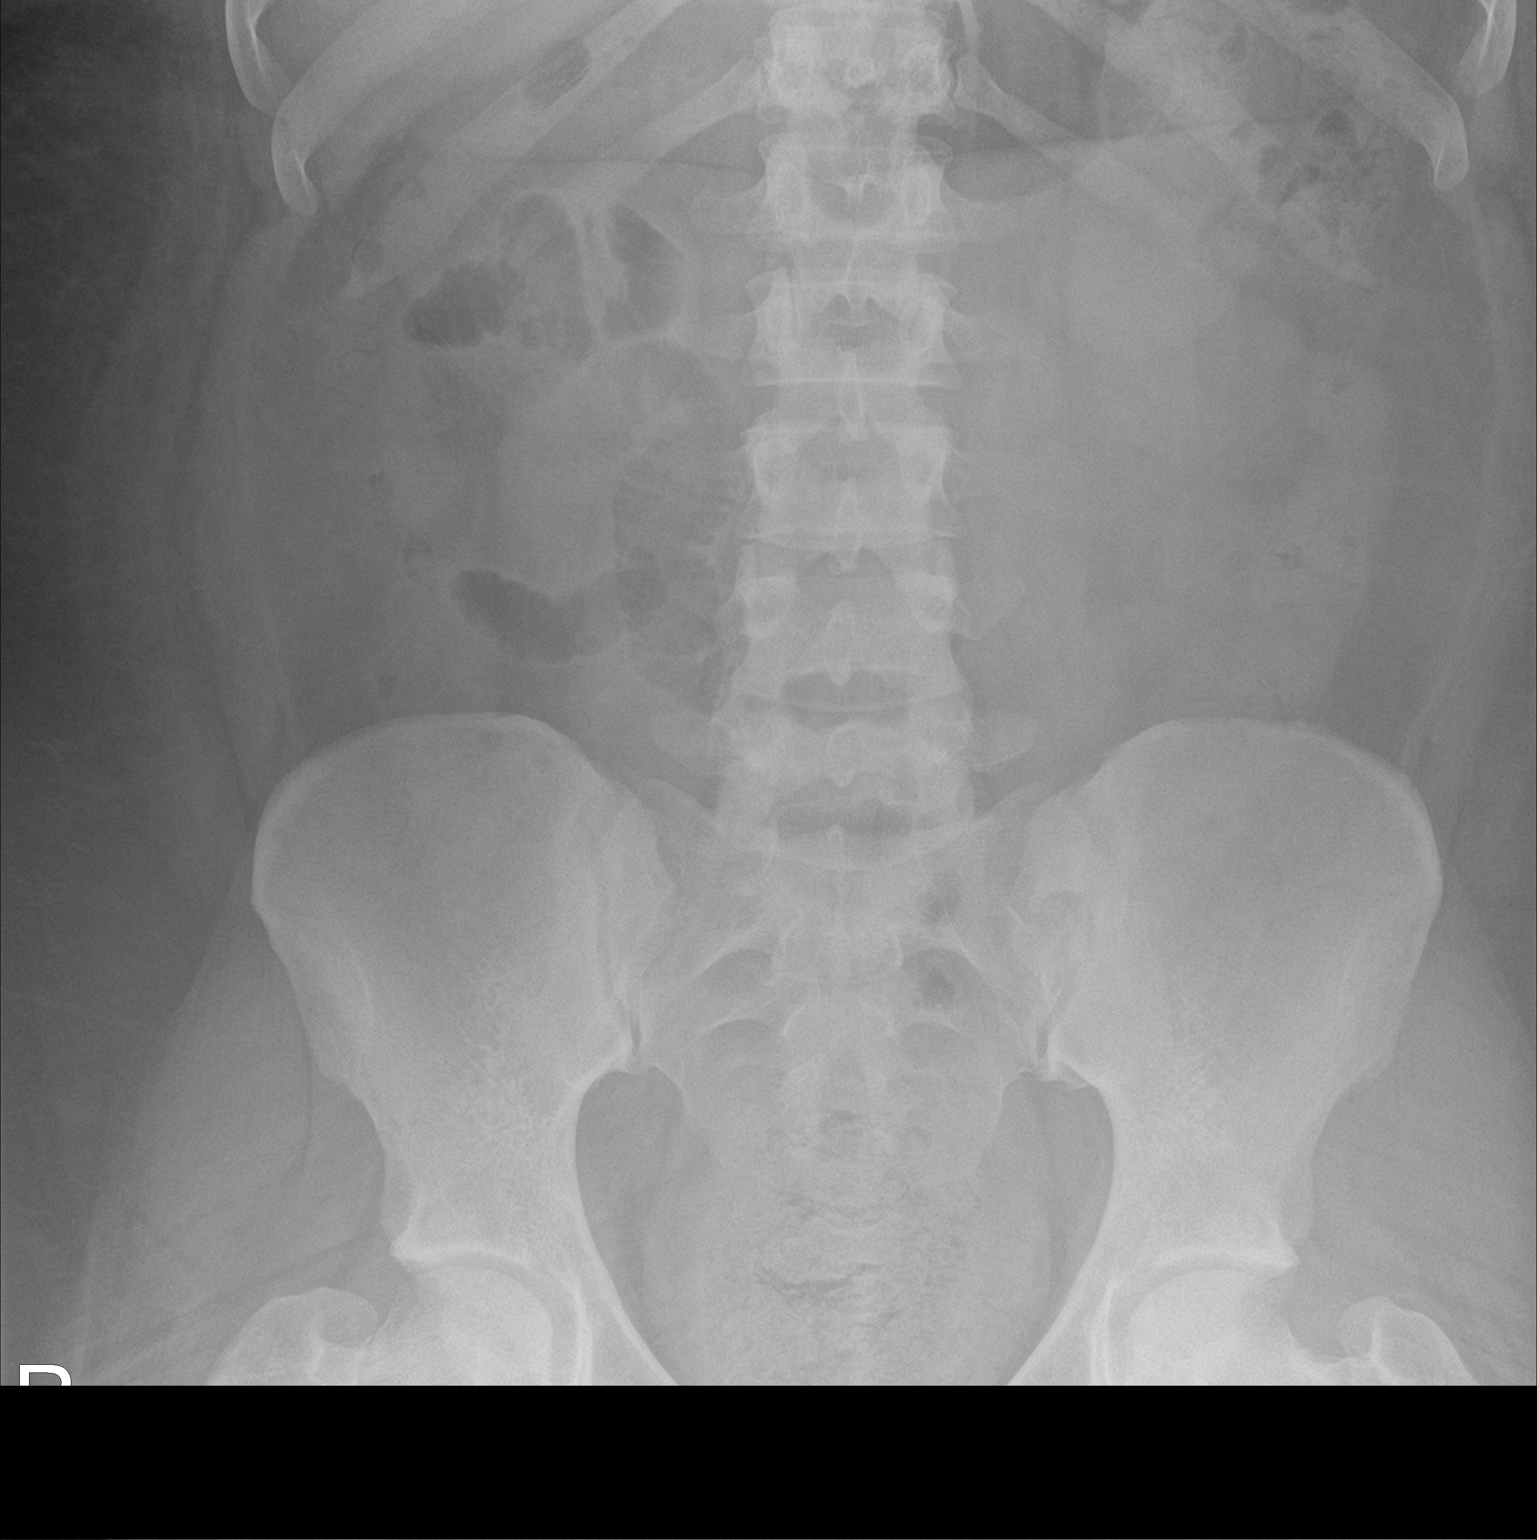

[abdomen kub (2 of 2)]
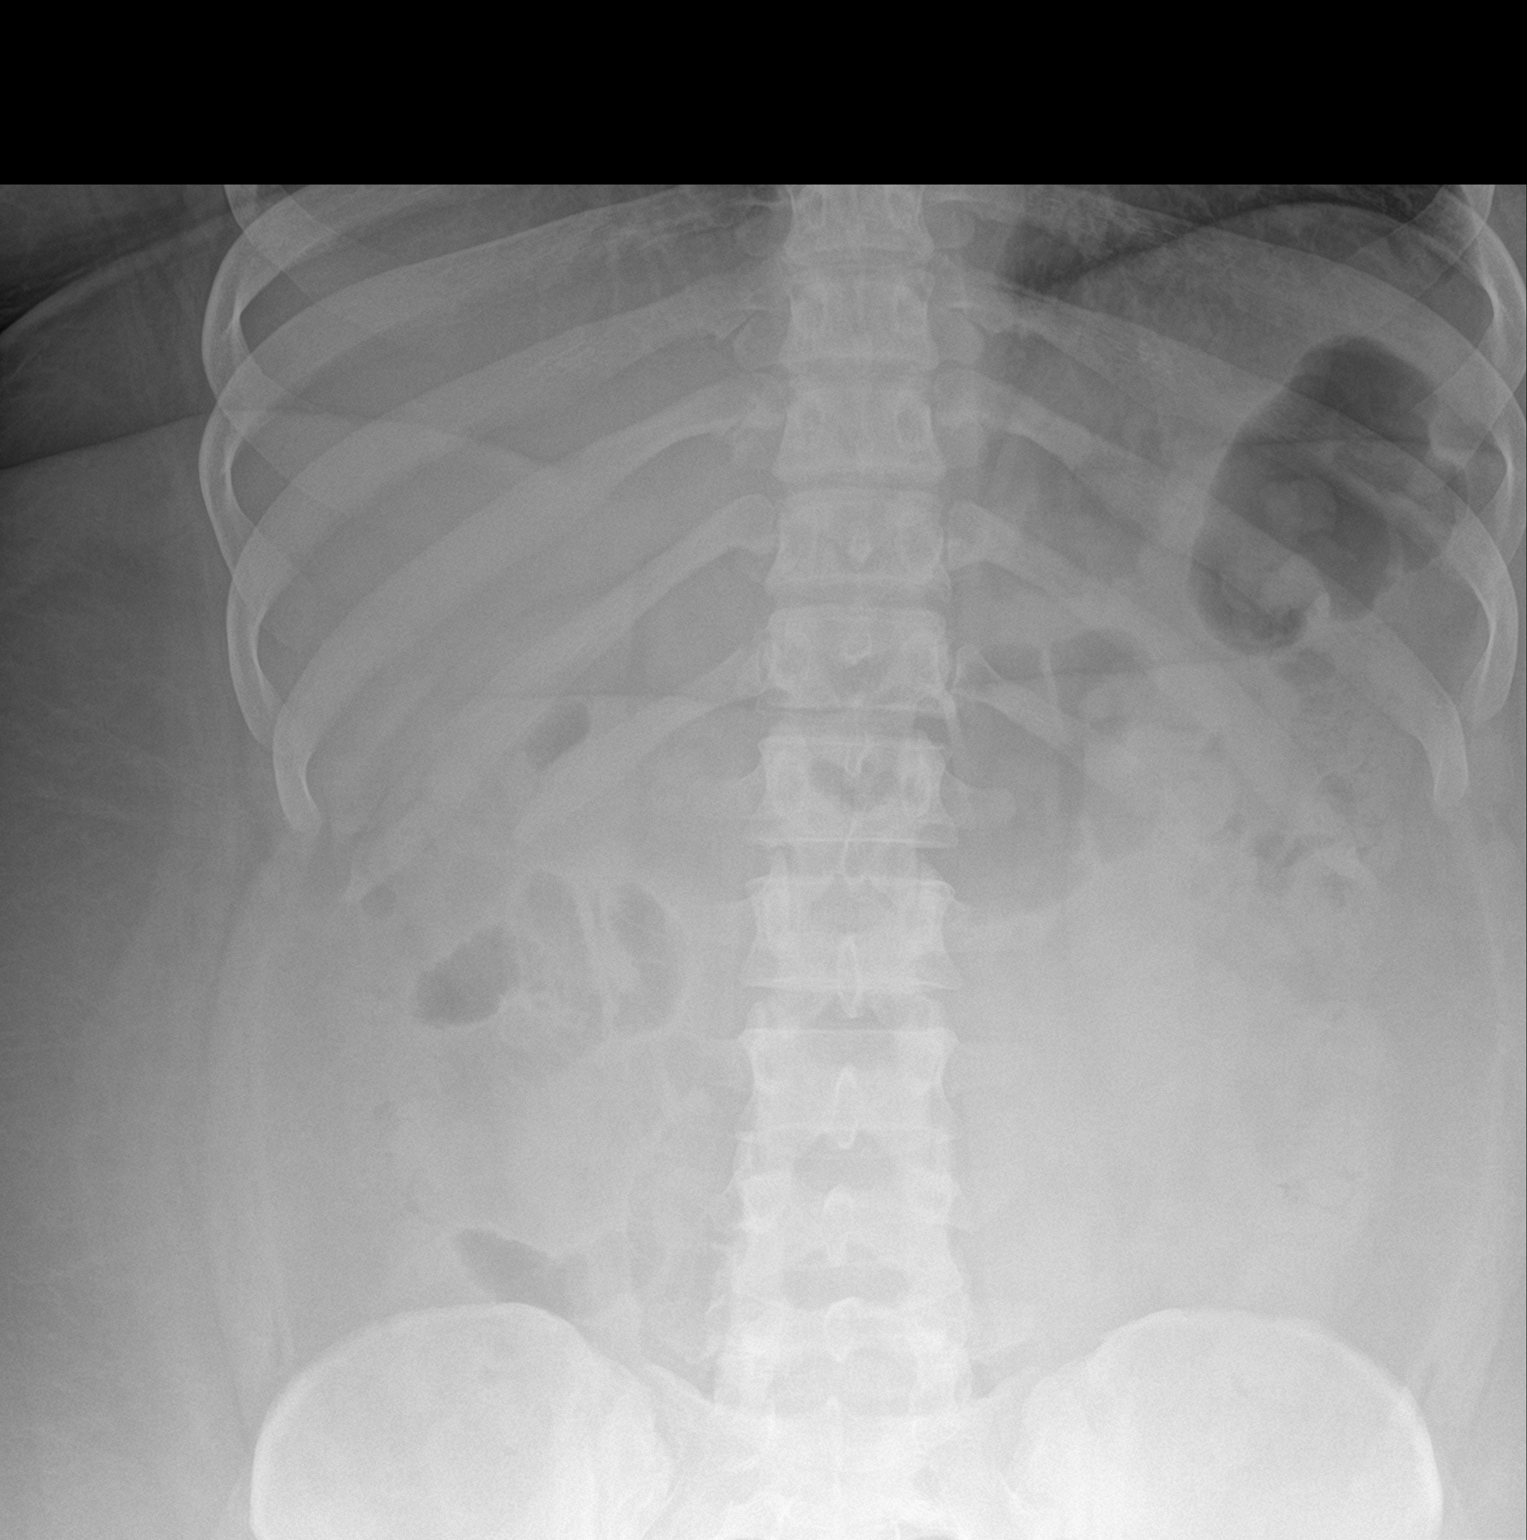

[2 of 2 positions shown; findings below may reference images not displayed]

FINDINGS: No high-grade obstructive bowel gas pattern. Moderate colonic stool
burden with air and stool over the rectal vault. No suspicious
abdominal calcifications. Included lung bases are clear. Osseous
structures are free of acute abnormality.
IMPRESSION: Moderate colonic stool burden.  No high-grade obstruction.
